# Patient Record
Sex: Female | Born: 1990 | Race: White | Hispanic: No | Marital: Single | Smoking: Never smoker
Health system: Southern US, Community
[De-identification: ages and names within clinical notes are randomized; demographics above are authoritative.]

## PROBLEM LIST (undated history)

## (undated) DIAGNOSIS — T7840XA Allergy, unspecified, initial encounter: Secondary | ICD-10-CM

## (undated) DIAGNOSIS — G43909 Migraine, unspecified, not intractable, without status migrainosus: Secondary | ICD-10-CM

## (undated) DIAGNOSIS — K219 Gastro-esophageal reflux disease without esophagitis: Secondary | ICD-10-CM

## (undated) DIAGNOSIS — N83209 Unspecified ovarian cyst, unspecified side: Secondary | ICD-10-CM

## (undated) DIAGNOSIS — R51 Headache: Secondary | ICD-10-CM

## (undated) DIAGNOSIS — R519 Headache, unspecified: Secondary | ICD-10-CM

## (undated) HISTORY — DX: Headache, unspecified: R51.9

## (undated) HISTORY — DX: Migraine, unspecified, not intractable, without status migrainosus: G43.909

## (undated) HISTORY — DX: Gastro-esophageal reflux disease without esophagitis: K21.9

## (undated) HISTORY — DX: Headache: R51

## (undated) HISTORY — DX: Unspecified ovarian cyst, unspecified side: N83.209

## (undated) HISTORY — DX: Allergy, unspecified, initial encounter: T78.40XA

## (undated) HISTORY — PX: EYE SURGERY: SHX253

---

## 2015-08-02 ENCOUNTER — Ambulatory Visit (INDEPENDENT_AMBULATORY_CARE_PROVIDER_SITE_OTHER): Payer: BLUE CROSS/BLUE SHIELD | Admitting: Primary Care

## 2015-08-02 ENCOUNTER — Other Ambulatory Visit (HOSPITAL_COMMUNITY)
Admission: RE | Admit: 2015-08-02 | Discharge: 2015-08-02 | Disposition: A | Discharge status: Home / Self Care | Payer: BLUE CROSS/BLUE SHIELD | Source: Ambulatory Visit | Attending: Primary Care | Admitting: Primary Care

## 2015-08-02 ENCOUNTER — Encounter: Payer: Self-pay | Admitting: Primary Care

## 2015-08-02 VITALS — BP 114/72 | HR 77 | Temp 98.6°F | Ht 65.0 in | Wt 190.8 lb

## 2015-08-02 DIAGNOSIS — Z01419 Encounter for gynecological examination (general) (routine) without abnormal findings: Secondary | ICD-10-CM | POA: Diagnosis present

## 2015-08-02 DIAGNOSIS — Z0000 Encounter for general adult medical examination without abnormal findings: Secondary | ICD-10-CM | POA: Diagnosis not present

## 2015-08-02 DIAGNOSIS — Z8742 Personal history of other diseases of the female genital tract: Secondary | ICD-10-CM

## 2015-08-02 DIAGNOSIS — Z1151 Encounter for screening for human papillomavirus (HPV): Secondary | ICD-10-CM | POA: Insufficient documentation

## 2015-08-02 DIAGNOSIS — Z124 Encounter for screening for malignant neoplasm of cervix: Secondary | ICD-10-CM | POA: Diagnosis not present

## 2015-08-02 DIAGNOSIS — R51 Headache: Secondary | ICD-10-CM

## 2015-08-02 DIAGNOSIS — Z8349 Family history of other endocrine, nutritional and metabolic diseases: Secondary | ICD-10-CM

## 2015-08-02 DIAGNOSIS — R519 Headache, unspecified: Secondary | ICD-10-CM | POA: Insufficient documentation

## 2015-08-02 LAB — LIPID PANEL
CHOL/HDL RATIO: 5
CHOLESTEROL: 197 mg/dL (ref 0–200)
HDL: 37.2 mg/dL — ABNORMAL LOW (ref >39.00)
LDL CALC: 135 mg/dL — AB (ref 0–99)
NonHDL: 160.27
Triglycerides: 124 mg/dL (ref 0.0–149.0)
VLDL: 24.8 mg/dL (ref 0.0–40.0)

## 2015-08-02 LAB — COMPREHENSIVE METABOLIC PANEL
ALBUMIN: 4.3 g/dL (ref 3.5–5.2)
ALT: 14 U/L (ref 0–35)
AST: 14 U/L (ref 0–37)
Alkaline Phosphatase: 67 U/L (ref 39–117)
BUN: 10 mg/dL (ref 6–23)
CHLORIDE: 103 meq/L (ref 96–112)
CO2: 27 meq/L (ref 19–32)
CREATININE: 0.82 mg/dL (ref 0.40–1.20)
Calcium: 9.4 mg/dL (ref 8.4–10.5)
GFR: 89.73 mL/min (ref >60.00)
GLUCOSE: 86 mg/dL (ref 70–99)
POTASSIUM: 3.6 meq/L (ref 3.5–5.1)
SODIUM: 137 meq/L (ref 135–145)
Total Bilirubin: 0.4 mg/dL (ref 0.2–1.2)
Total Protein: 7.8 g/dL (ref 6.0–8.3)

## 2015-08-02 LAB — T3, FREE: T3, Free: 3.6 pg/mL (ref 2.3–4.2)

## 2015-08-02 LAB — TSH: TSH: 2.91 u[IU]/mL (ref 0.35–4.50)

## 2015-08-02 LAB — T4, FREE: FREE T4: 0.81 ng/dL (ref 0.60–1.60)

## 2015-08-02 MED ORDER — PROPRANOLOL HCL ER 60 MG PO CP24: 60.0000 mg | ORAL_CAPSULE | Freq: Every day | ORAL | 1 refills | Status: DC

## 2015-08-02 NOTE — Assessment & Plan Note (Addendum)
Immunizations up-to-date. Pap completed in 2016, patient would like completed today, performed an Pap pending. History of right ovarian cyst that requires follow-up, pelvic and vaginal ultrasound ordered and are pending. Pelvic exam unremarkable today. Poor diet with some exercise. Discussed to reduce calorie consumption to 1200-1400 cal daily. Discussed to reduce intake of pasta, rice, processed carbohydrates. Increase lean protein and vegetables.  Exam unremarkable. Labs pending. Will check TSH given family history of hypothyroidism and presence of other symptoms.  Follow-up in one year for repeat physical or sooner if needed.

## 2015-08-02 NOTE — Assessment & Plan Note (Signed)
Currently managed on propanolol ER 60 mg once daily. Very well managed on this medication with hardly any headaches and infrequent migraines. Heart rate stable in office today. Neuro exam unremarkable. Refill upper panel all sent to pharmacy.

## 2015-08-02 NOTE — Patient Instructions (Signed)
Complete lab work prior to leaving today.   Work to increase consumption of vegetables, fruit, whole grains, water. Reduce carbohydrates in the form of bread, rice, pasta, granola.   Ensure you are consuming 64 ounces of water daily.  Continue regular exercise.  Try to aim for 1500 calories daily.  You will be contacted regarding your ultrasound.  Please let us know if you have not heard back within one week.   I will be in touch with you regarding your lab results, including your pap.  Follow up in 1 year for repeat physical.   It was a pleasure to meet you today! Please don't hesitate to call me with any questions. Welcome to [facility]!

## 2015-08-02 NOTE — Progress Notes (Signed)
Pre visit review using our clinic review tool, if applicable. No additional management support is needed unless otherwise documented below in the visit note. 

## 2015-08-02 NOTE — Progress Notes (Signed)
Subjective:    Patient ID: Jill Watson, female    DOB: [birth year], 25 y.o.   MRN: [id number]  HPI  Jill Watson is a 25 year old female who presents today to establish care, for complete physical, and discuss the problems mentioned below. Will obtain old records.  1) Frequent Headaches/Migraines: Diagnosed several years ago. Currently managed on Propranolol ER 60 mg since 2010. She experiencees headaches much less frequently since managed on Propranolol. She will experience migraines every 2-3 months which is much less frequent than prior to Propranolol use.Overall she feels well managed.  2) Family history of Hypothyroidism: Experiencing fatigue and difficulty losing weight intermittently since 2009. She does experience intolerance to the cold air. Denies hair growth, palpitations, chest pain. She would like evaluation given her symptoms and family history.  Immunizations: -Tetanus: Tdap in March 2017 -Influenza: Completed in Fall 2016  Diet: She endorses a healthy diet. She tracks calories on My Fitness Pal and will eat less than 1800 calories per day. Breakfast: Yogurt and granola Lunch: Frozen dinner, chicken and rice, sandwiches Dinner: Tacos, pizza, [place] burgers, pasta, chicken dishes Snacks: Strawberries, canned fruit, kiwi, chips, pretzel  Desserts: Chocolate pieces daily Beverages: Water, diet coke, crystal light  Exercise: She exercises three times weekly and will work out for 45 min-1 hour Eye exam: Completed in March 2017 Dental exam: Completes semi-annually Pap Smear: Completed in June 2016, normal.    Review of Systems  Constitutional: Negative for unexpected weight change.  HENT: Negative for rhinorrhea.   Respiratory: Negative for cough and shortness of breath.   Cardiovascular: Negative for chest pain.  Gastrointestinal: Negative for constipation and diarrhea.  Genitourinary: Negative for difficulty urinating and menstrual problem.  Musculoskeletal: Negative  for arthralgias and myalgias.  Skin: Negative for rash.  Allergic/Immunologic: Negative for environmental allergies.  Neurological: Negative for dizziness, numbness and headaches.  Psychiatric/Behavioral:       Denies concerns for anxiety or depression.       Past Medical History:  Diagnosis Date  . Allergy   . Frequent headaches   . GERD (gastroesophageal reflux disease)   . Migraine   . Ovarian cyst    Right     Social History   Social History  . Marital status: Single    Spouse name: N/A  . Number of children: N/A  . Years of education: N/A   Occupational History  . Not on file.   Social History Main Topics  . Smoking status: Never Smoker  . Smokeless tobacco: Never Used  . Alcohol use Yes     Comment: social  . Drug use: No  . Sexual activity: Not on file   Other Topics Concern  . Not on file   Social History Narrative   In a relationship.   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   Enjoys reading, baking, travel.    Past Surgical History:  Procedure Laterality Date  . EYE SURGERY  1993, 1994, 1999    Family History  Problem Relation Age of Onset  . Hyperlipidemia Mother   . Hypertension Mother   . Hypothyroidism Mother   . Hypertension Father   . Arthritis Maternal Grandmother   . Heart disease Maternal Grandmother   . Stroke Paternal Grandfather   . Heart disease Paternal Grandfather   . Ovarian cancer Other   . Breast cancer Other     No Known Allergies  No current outpatient prescriptions on file prior to  visit.   No current facility-administered medications on file prior to visit.     BP 114/72 (BP Location: Right Arm, Patient Position: Sitting, Cuff Size: Normal)   Pulse 77   Temp 98.6 F (37 C) (Oral)   Ht  (1.651 m)   Wt 190 lb 12.8 oz (86.5 kg)   LMP 07/16/2015   SpO2 98%   BMI 31.75 kg/m    Objective:   Physical Exam  Constitutional: She is oriented to person, place, and time. She  appears well-nourished.  HENT:  Right Ear: Tympanic membrane and ear canal normal.  Left Ear: Tympanic membrane and ear canal normal.  Nose: Nose normal.  Mouth/Throat: Oropharynx is clear and moist.  Eyes: Conjunctivae and EOM are normal. Pupils are equal, round, and reactive to light.  Neck: Neck supple. No thyromegaly present.  Cardiovascular: Normal rate and regular rhythm.   No murmur heard. Pulmonary/Chest: Effort normal and breath sounds normal. She has no rales.  Abdominal: Soft. Bowel sounds are normal. There is no tenderness.  Genitourinary: There is no tenderness on the right labia. There is no tenderness on the left labia. Cervix exhibits no motion tenderness and no discharge. Right adnexum displays no mass. Left adnexum displays no mass. No vaginal discharge found.  Musculoskeletal: Normal range of motion.  Lymphadenopathy:    She has no cervical adenopathy.  Neurological: She is alert and oriented to person, place, and time. She has normal reflexes. No cranial nerve deficit.  Skin: Skin is warm and dry. No rash noted.  Psychiatric: She has a normal mood and affect.          Assessment & Plan:

## 2015-08-05 ENCOUNTER — Encounter: Payer: Self-pay | Admitting: *Deleted

## 2015-08-06 ENCOUNTER — Encounter: Payer: Self-pay | Admitting: Primary Care

## 2015-08-06 ENCOUNTER — Other Ambulatory Visit: Payer: Self-pay | Admitting: Primary Care

## 2015-08-06 DIAGNOSIS — R5383 Other fatigue: Secondary | ICD-10-CM

## 2015-08-06 LAB — CYTOLOGY - PAP

## 2015-08-07 ENCOUNTER — Other Ambulatory Visit: Payer: Self-pay | Admitting: Primary Care

## 2015-08-07 ENCOUNTER — Encounter: Payer: Self-pay | Admitting: Primary Care

## 2015-08-07 DIAGNOSIS — Z3041 Encounter for surveillance of contraceptive pills: Secondary | ICD-10-CM

## 2015-08-07 MED ORDER — NORETHIN ACE-ETH ESTRAD-FE 1-20 MG-MCG PO TABS: 1.0000 | ORAL_TABLET | Freq: Every day | ORAL | 3 refills | Status: DC

## 2015-08-09 ENCOUNTER — Ambulatory Visit
Admission: RE | Admit: 2015-08-09 | Discharge: 2015-08-09 | Disposition: A | Discharge status: Home / Self Care | Payer: BLUE CROSS/BLUE SHIELD | Source: Ambulatory Visit | Attending: Primary Care | Admitting: Primary Care

## 2015-08-09 DIAGNOSIS — Z8742 Personal history of other diseases of the female genital tract: Secondary | ICD-10-CM | POA: Insufficient documentation

## 2016-02-01 ENCOUNTER — Other Ambulatory Visit: Payer: Self-pay | Admitting: Primary Care

## 2016-02-01 DIAGNOSIS — R51 Headache: Principal | ICD-10-CM

## 2016-02-01 DIAGNOSIS — R519 Headache, unspecified: Secondary | ICD-10-CM

## 2016-02-03 NOTE — Telephone Encounter (Signed)
Ok to refill? Electronically refill request for   propranolol ER (INDERAL LA) 60 MG 24 hr capsule  Last prescribed and seen on 08/02/2015. No future appt

## 2016-06-30 ENCOUNTER — Other Ambulatory Visit: Payer: Self-pay | Admitting: Primary Care

## 2016-06-30 DIAGNOSIS — Z3041 Encounter for surveillance of contraceptive pills: Secondary | ICD-10-CM

## 2016-07-30 ENCOUNTER — Other Ambulatory Visit: Payer: Self-pay | Admitting: Primary Care

## 2016-07-30 DIAGNOSIS — R51 Headache: Principal | ICD-10-CM

## 2016-07-30 DIAGNOSIS — R519 Headache, unspecified: Secondary | ICD-10-CM

## 2016-08-17 IMAGING — US US PELVIS COMPLETE
1 series · 14 of 25 positions shown · non-contrast
Comparison: None available in PACs

CLINICAL DATA: Follow-up right ovarian cyst

EXAM:
TRANSABDOMINAL AND TRANSVAGINAL ULTRASOUND OF PELVIS
TECHNIQUE: Both transabdominal and transvaginal ultrasound examinations of the
pelvis were performed. Transabdominal technique was performed for
global imaging of the pelvis including uterus, ovaries, adnexal
regions, and pelvic cul-de-sac. It was necessary to proceed with
endovaginal exam following the transabdominal exam to visualize the
uterus, endometrium, ovaries, and adnexal regions..

[Series 1: us pelvis complete · 0.22mm/px · 14 of 79 slices shown]
[im 1/79]
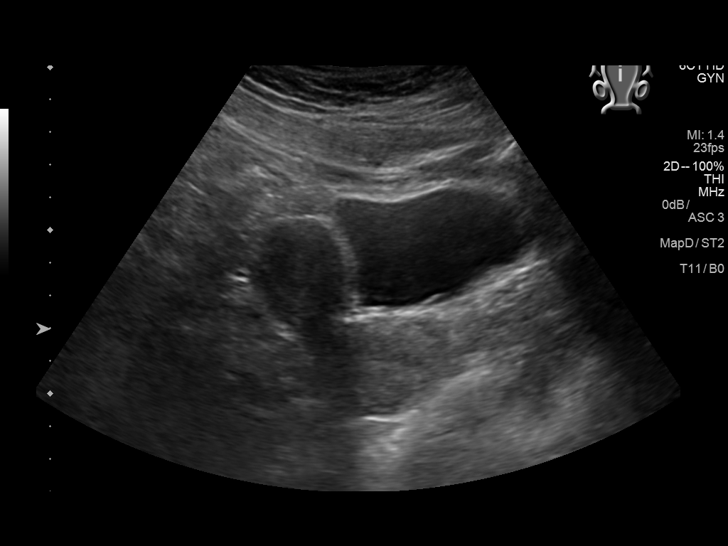
[im 7/79]
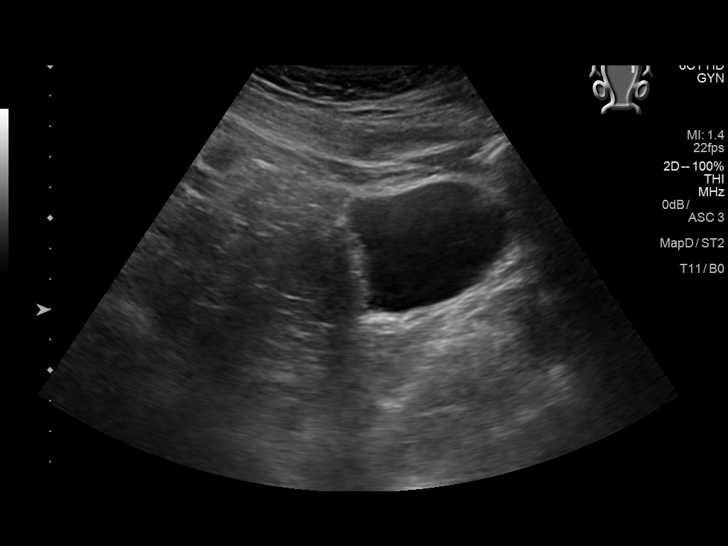
[im 14/79]
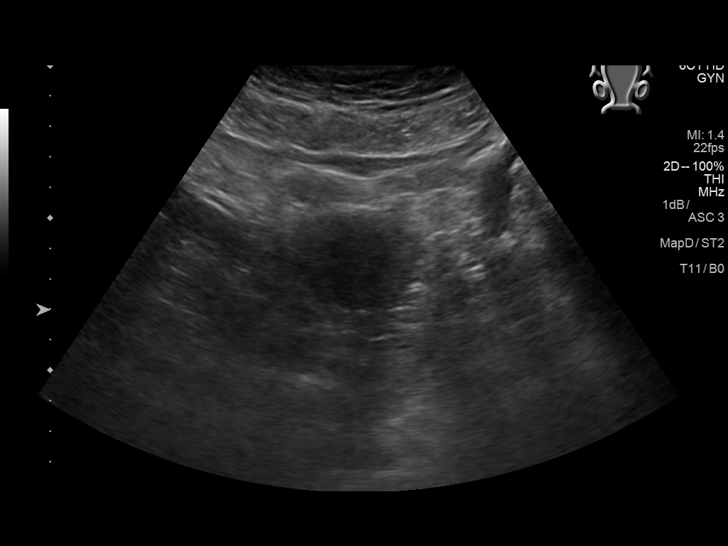
[im 20/79]
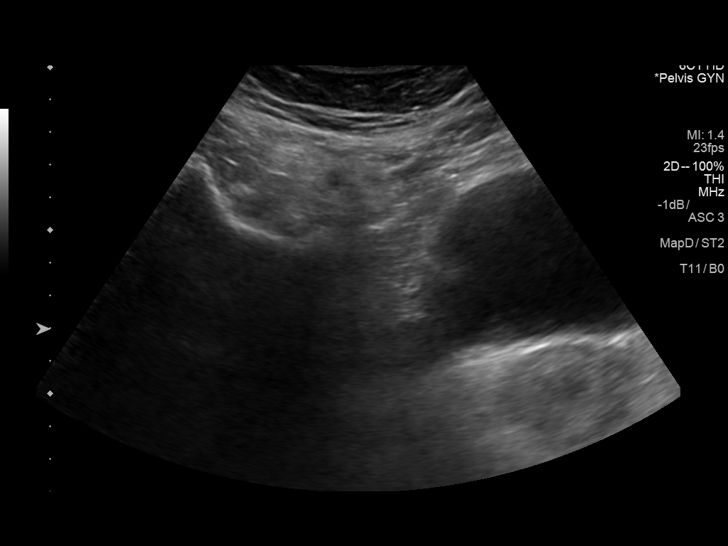
[im 27/79]
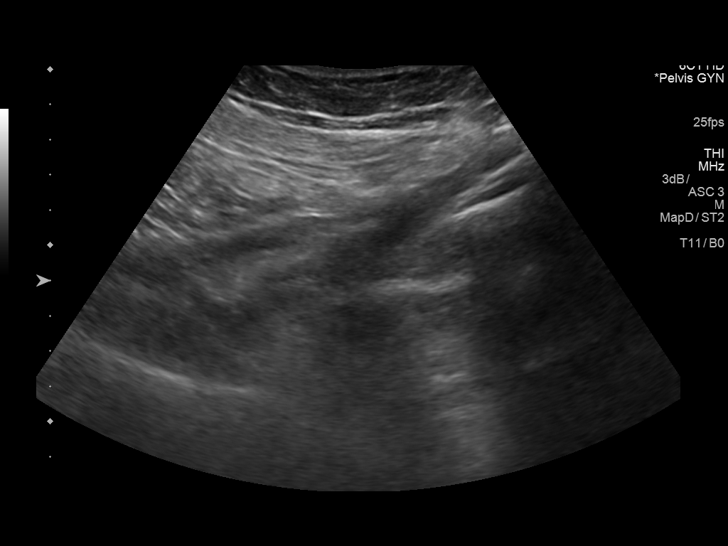
[im 30/79]
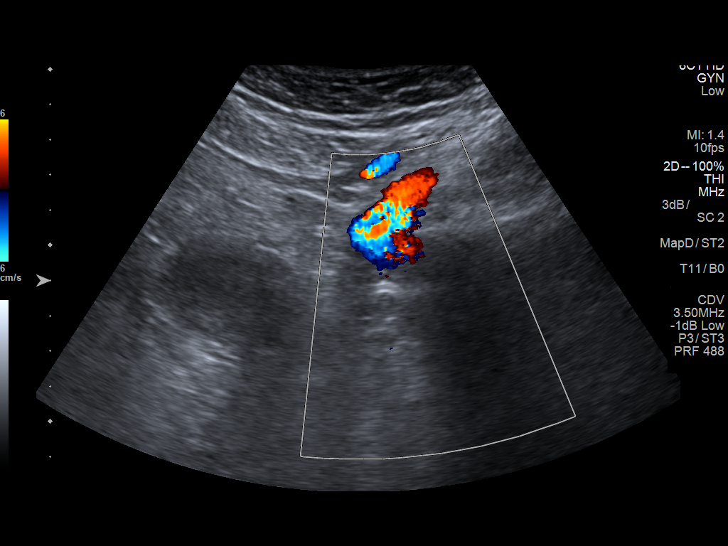
[im 36/79]
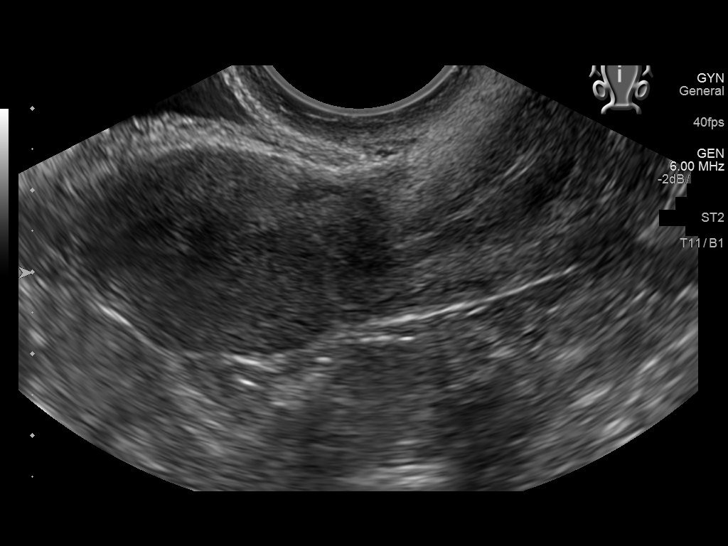
[im 43/79]
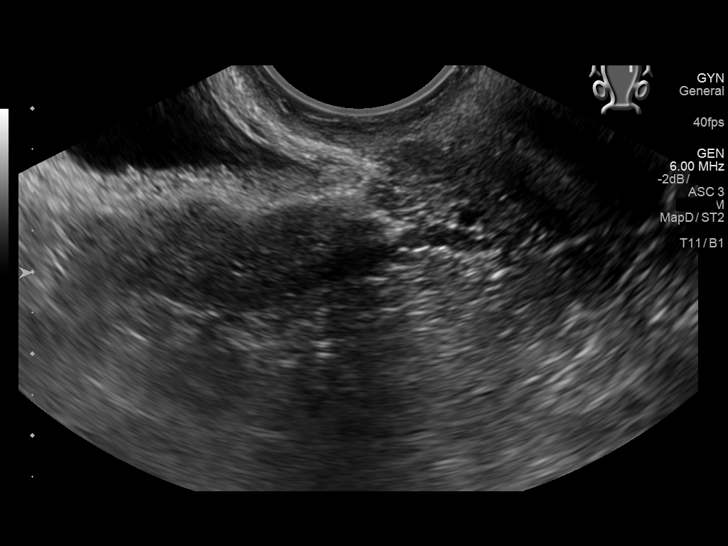
[im 49/79]
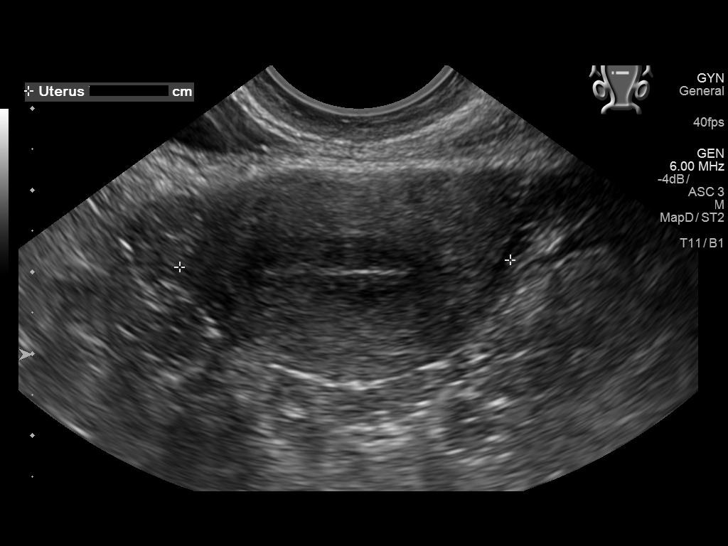
[im 53/79]
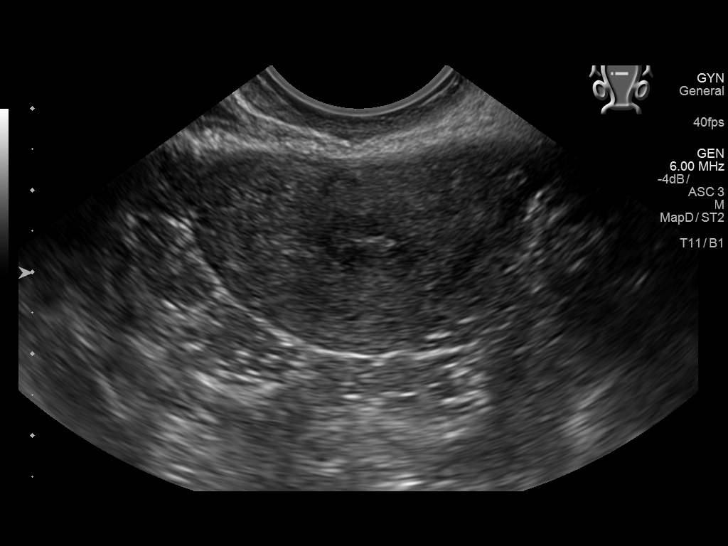
[im 59/79]
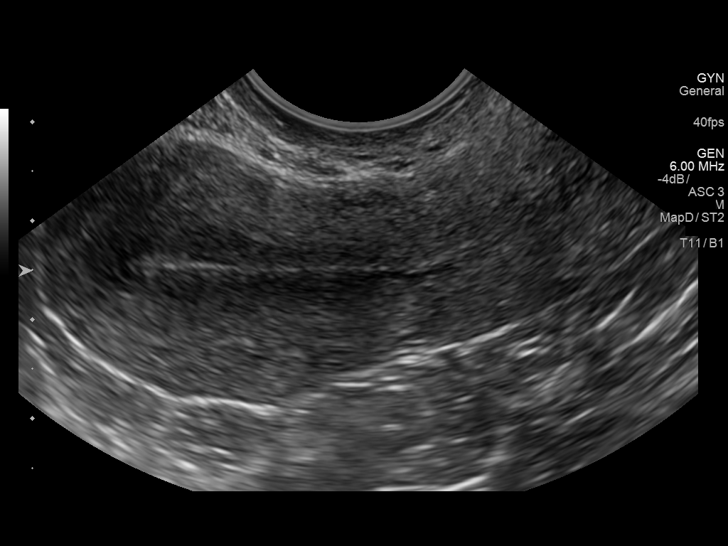
[im 66/79]
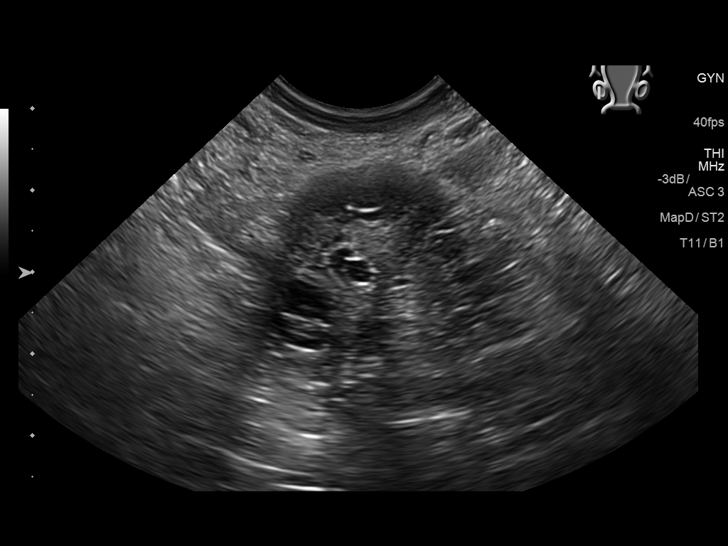
[im 72/79]
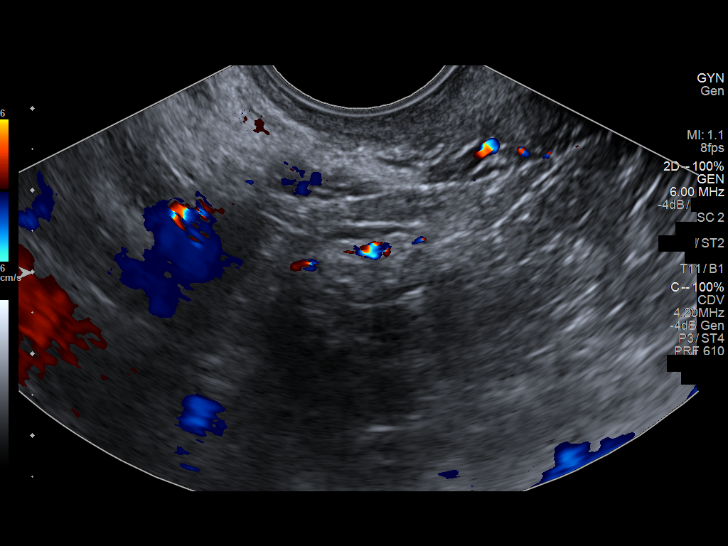
[im 79/79]
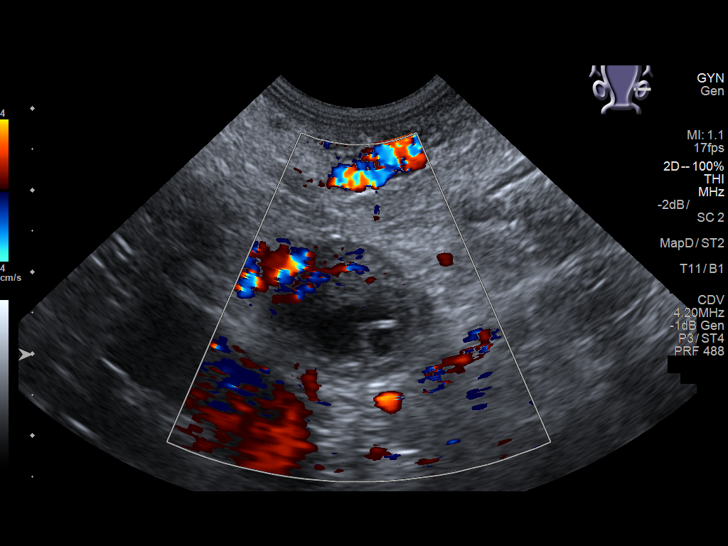

[14 of 25 positions shown; findings below may reference images not displayed]

FINDINGS: Uterus

Measurements: 6.5 x 3.4 x 4.3 cm. No fibroids or other mass
visualized.

Endometrium

Thickness: 3 mm.  No focal abnormality visualized.

Right ovary

Measurements: 2.7 x 1.7 x 1.6 cm. No cystic or solid ovarian mass is
observed.

Left ovary

Measurements: 2.1 x 2.2 x 1.8 cm. Normal appearance/no adnexal mass.

Other findings

No free pelvic fluid.
IMPRESSION: No cystic or solid adnexal mass is observed. Normal appearance of
the ovaries, uterus, and endometrium.

## 2016-09-29 ENCOUNTER — Other Ambulatory Visit: Payer: Self-pay | Admitting: Primary Care

## 2016-09-29 DIAGNOSIS — Z3041 Encounter for surveillance of contraceptive pills: Secondary | ICD-10-CM

## 2016-10-26 ENCOUNTER — Other Ambulatory Visit: Payer: Self-pay | Admitting: Primary Care

## 2016-10-26 DIAGNOSIS — R51 Headache: Principal | ICD-10-CM

## 2016-10-26 DIAGNOSIS — Z3041 Encounter for surveillance of contraceptive pills: Secondary | ICD-10-CM

## 2016-10-26 DIAGNOSIS — R519 Headache, unspecified: Secondary | ICD-10-CM

## 2016-10-28 ENCOUNTER — Ambulatory Visit
Admission: EM | Admit: 2016-10-28 | Discharge: 2016-10-28 | Disposition: A | Discharge status: Home / Self Care | Payer: BLUE CROSS/BLUE SHIELD | Attending: Family Medicine | Admitting: Family Medicine

## 2016-10-28 ENCOUNTER — Encounter: Payer: Self-pay | Admitting: *Deleted

## 2016-10-28 DIAGNOSIS — J029 Acute pharyngitis, unspecified: Secondary | ICD-10-CM

## 2016-10-28 DIAGNOSIS — J069 Acute upper respiratory infection, unspecified: Secondary | ICD-10-CM

## 2016-10-28 LAB — RAPID STREP SCREEN (MED CTR MEBANE ONLY): Streptococcus, Group A Screen (Direct): NEGATIVE

## 2016-10-28 NOTE — ED Triage Notes (Signed)
Sore throat, non-productive cough, headache x2 days.

## 2016-10-28 NOTE — ED Provider Notes (Signed)
[facility] ____________________________________________  Time seen: Approximately 2:58 PM  I have reviewed the triage vital signs and the nursing notes.   HISTORY  Chief Complaint Sore Throat; Cough; and Headache  HPI Jill Watson is a 26 y.o. female  Presents for evaluation of 3 days of sore throat with gradual onset of runny nose and nasal congestion with occasional cough and congestion. Patient reports sore throat has continued. Reports has continued to eat and drink well. States did take Aleve D today that did help some of her symptoms. Denies known fevers. States sore throat was worse in the mornings but somewhat throughout the day. 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 Reports has continued pain active. Denies other aggravating of the factors. Reports of the muscles well. Denies chest pain, shortness of breath, abdominal pain, dysuria, or rash. Denies recent sickness. Denies recent antibiotic use.   Patient's last menstrual period was 10/28/2016.Denies pregnancy. Doreene Nest, NP: PCP    Past Medical History:  Diagnosis Date  . Allergy   . Frequent headaches   . GERD (gastroesophageal reflux disease)   . Migraine   . Ovarian cyst    Right    Patient Active Problem List   Diagnosis Date Noted  . Preventative health care 08/02/2015  . Frequent headaches 08/02/2015    Past Surgical History:  Procedure Laterality Date  . EYE SURGERY  1993, 1994, 1999     No current facility-administered medications for this encounter.   Current Outpatient Prescriptions:  .  escitalopram (LEXAPRO) 5 MG tablet, Take 5 mg by mouth daily., Disp: , Rfl:  .  fexofenadine (ALLEGRA) 180 MG tablet, Take 180 mg by mouth daily., Disp: , Rfl:  .  norethindrone-ethinyl estradiol (JUNEL FE 1/20) 1-20 MG-MCG tablet, Take 1 tablet by mouth daily. NEED OFFICE VISIT FOR ANY MORE REFILLS, Disp: 28 tablet, Rfl: 0 .   propranolol ER (INDERAL LA) 60 MG 24 hr capsule, Take 1 capsule (60 mg total) by mouth daily. NEED OFFICE VISIT FOR ANY MORE REFILLS, Disp: 30 capsule, Rfl: 0  Allergies Patient has no known allergies.  Family History  Problem Relation Age of Onset  . Hyperlipidemia Mother   . Hypertension Mother   . Hypothyroidism Mother   . Hypertension Father   . Arthritis Maternal Grandmother   . Heart disease Maternal Grandmother   . Stroke Paternal Grandfather   . Heart disease Paternal Grandfather   . Ovarian cancer Other   . Breast cancer Other     Social History Social History  Substance Use Topics  . Smoking status: Never Smoker  . Smokeless tobacco: Never Used  . Alcohol use Yes     Comment: social    Review of Systems Constitutional: No fever/chills ENT: Positive sore throat. Cardiovascular: Denies chest pain. Respiratory: Denies shortness of breath. Gastrointestinal: No abdominal pain.  No nausea, no vomiting.  No diarrhea.   Musculoskeletal: Negative for back pain. Skin: Negative for rash.   ____________________________________________   PHYSICAL EXAM:  VITAL SIGNS: ED Triage Vitals  Enc Vitals Group     BP 10/28/16 1431 122/75     Pulse Rate 10/28/16 1431 60     Resp 10/28/16 1431 16     Temp 10/28/16 1431 98.1 F (36.7 C)     Temp Source 10/28/16 1431 Oral     SpO2 10/28/16 1431 100 %     Weight 10/28/16 1433 185 lb (83.9 kg)  Height 10/28/16 1433 5\' 6"  (1.676 m)     Head Circumference --      Peak Flow --      Pain Score --      Pain Loc --      Pain Edu? --      Excl. in GC? --     Constitutional: Alert and oriented. Well appearing and in no acute distress. Eyes: Conjunctivae are normal.  Head: Atraumatic. No sinus tenderness to palpation. No swelling. No erythema.  Ears: No erythema, normal TMs bilaterally.   Nose: Mild nasal congestion  Mouth/Throat: Mucous membranes are moist. Mild pharyngeal erythema. No tonsillar swelling or exudate.    Neck: No stridor.  No cervical spine tenderness to palpation. Hematological/Lymphatic/Immunilogical: No cervical lymphadenopathy. Cardiovascular: Normal rate, regular rhythm. Grossly normal heart sounds.  Good peripheral circulation. Respiratory: Normal respiratory effort.  No retractions. No wheezes, rales or rhonchi. Good air movement.  Gastrointestinal: Soft and nontender.  Musculoskeletal: Ambulatory with steady gait. No cervical, thoracic or lumbar tenderness to palpation. Neurologic:  Normal speech and language. No gait instability. Skin:  Skin appears warm, dry and intact. No rash noted. Psychiatric: Mood and affect are normal. Speech and behavior are normal.  ___________________________________________   LABS (all labs ordered are listed, but only abnormal results are displayed)  Labs Reviewed  RAPID STREP SCREEN (NOT AT [facility])  CULTURE, GROUP A STREP [facility])     PROCEDURES Procedures    INITIAL IMPRESSION / ASSESSMENT AND PLAN / ED COURSE  Pertinent labs & imaging results that were available during my care of the patient were reviewed by me and considered in my medical decision making (see chart for details).  Well-appearing patient. No acute distress. Suspect viral upper respiratory infection of viral pharyngitis. Quick strep negative, will culture. Encouraged rest, fluids, supportive care, declined prescription of viscous lidocaine. OTC supportive medications. Declines need of work or school note.   Discussed follow up with Primary care physician this week as needed. Discussed follow up and return parameters including no resolution or any worsening concerns. Patient verbalized understanding and agreed to plan.   ____________________________________________   FINAL CLINICAL IMPRESSION(S) / ED DIAGNOSES  Final diagnoses:  Upper respiratory tract infection, unspecified type  Pharyngitis, unspecified etiology     Discharge Medication List as of 10/28/2016  3:06 PM       Note: This dictation was prepared with Dragon dictation along with smaller phrase technology. Any transcriptional errors that result from this process are unintentional.         Renford DillsMiller, Tinleigh Whitmire, NP 10/28/16 1559

## 2016-10-28 NOTE — Discharge Instructions (Signed)
Rest. Drink plenty of fluids.  ° °Follow up with your primary care physician this week as needed. Return to Urgent care for new or worsening concerns.  ° °

## 2016-10-31 LAB — CULTURE, GROUP A STREP (THRC)

## 2016-12-30 ENCOUNTER — Ambulatory Visit (INDEPENDENT_AMBULATORY_CARE_PROVIDER_SITE_OTHER): Payer: BLUE CROSS/BLUE SHIELD | Admitting: Primary Care

## 2016-12-30 ENCOUNTER — Encounter: Payer: Self-pay | Admitting: Primary Care

## 2016-12-30 VITALS — BP 102/64 | HR 62 | Temp 98.1°F | Ht 65.75 in | Wt 192.8 lb

## 2016-12-30 DIAGNOSIS — F411 Generalized anxiety disorder: Secondary | ICD-10-CM

## 2016-12-30 DIAGNOSIS — Z3041 Encounter for surveillance of contraceptive pills: Secondary | ICD-10-CM

## 2016-12-30 DIAGNOSIS — R51 Headache: Secondary | ICD-10-CM | POA: Diagnosis not present

## 2016-12-30 DIAGNOSIS — Z0000 Encounter for general adult medical examination without abnormal findings: Secondary | ICD-10-CM

## 2016-12-30 DIAGNOSIS — R519 Headache, unspecified: Secondary | ICD-10-CM

## 2016-12-30 LAB — COMPREHENSIVE METABOLIC PANEL
ALT: 18 U/L (ref 0–35)
AST: 17 U/L (ref 0–37)
Albumin: 4.2 g/dL (ref 3.5–5.2)
Alkaline Phosphatase: 104 U/L (ref 39–117)
BUN: 13 mg/dL (ref 6–23)
CHLORIDE: 101 meq/L (ref 96–112)
CO2: 27 meq/L (ref 19–32)
CREATININE: 0.84 mg/dL (ref 0.40–1.20)
Calcium: 8.9 mg/dL (ref 8.4–10.5)
GFR: 86.33 mL/min (ref >60.00)
Glucose, Bld: 89 mg/dL (ref 70–99)
Potassium: 3.9 mEq/L (ref 3.5–5.1)
SODIUM: 137 meq/L (ref 135–145)
Total Bilirubin: 0.5 mg/dL (ref 0.2–1.2)
Total Protein: 7.5 g/dL (ref 6.0–8.3)

## 2016-12-30 LAB — LIPID PANEL
CHOL/HDL RATIO: 5
Cholesterol: 185 mg/dL (ref 0–200)
HDL: 37.4 mg/dL — ABNORMAL LOW (ref >39.00)
LDL CALC: 125 mg/dL — AB (ref 0–99)
NonHDL: 147.85
Triglycerides: 112 mg/dL (ref 0.0–149.0)
VLDL: 22.4 mg/dL (ref 0.0–40.0)

## 2016-12-30 MED ORDER — NORETHIN ACE-ETH ESTRAD-FE 1-20 MG-MCG PO TABS: 1.0000 | ORAL_TABLET | Freq: Every day | ORAL | 3 refills | Status: AC

## 2016-12-30 MED ORDER — PROPRANOLOL HCL ER 60 MG PO CP24: 60.0000 mg | ORAL_CAPSULE | Freq: Every day | ORAL | 1 refills | Status: DC

## 2016-12-30 MED ORDER — ESCITALOPRAM OXALATE 5 MG PO TABS: 5.0000 mg | ORAL_TABLET | Freq: Every day | ORAL | 1 refills | Status: AC

## 2016-12-30 NOTE — Progress Notes (Signed)
Subjective:    Patient ID: Jill Watson, female    DOB: [birth year], 26 y.o.   MRN: [id number]  HPI  Jill Watson is a 26 year old female who presents today for complete physical and medication refills.   Frequent Headaches: She is needing a refill of her propranolol ER 60 mg once daily for prevention of recurrent headaches.   Menorrhagia: Long history of, previously on oral contraceptives for the past 10 years, stopped taking one month ago as she's curious to see how she does. She did notice her last menstrual cycle was heavier than normal.   GAD: Diagnosed in August 2018, currently managed on Lexapro 5 mg. Overall she's noticed some difference in her anxiety. 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   Immunizations: -Tetanus: Completed in 2017 -Influenza: Completed this season   Diet: She endorses a fair diet. Breakfast: Granola bar, yogurt Lunch: Vegetables, sandwiches, fast food Dinner: Meat, vegetable, starch Snacks: Occasionally, vegetables, yogurt, granola bar Desserts: Candy, 2-3 times weekly Beverages: Water, diet soda, sparkling water  Exercise: She is not currently exercising Eye exam: Completed in March 2018 Dental exam: Completes semi-annually  Pap Smear: Completed in 2017, negative   Review of Systems  Constitutional: Negative for unexpected weight change.  HENT: Negative for rhinorrhea.   Respiratory: Negative for cough and shortness of breath.   Cardiovascular: Negative for chest pain.  Gastrointestinal: Negative for constipation and diarrhea.  Genitourinary: Negative for difficulty urinating and menstrual problem.  Musculoskeletal: Negative for arthralgias and myalgias.  Skin: Negative for rash.  Allergic/Immunologic: Negative for environmental allergies.  Neurological: Negative for dizziness, numbness and headaches.  Psychiatric/Behavioral:       Doing well on Lexapro       Past Medical History:  Diagnosis Date  .  Allergy   . Frequent headaches   . GERD (gastroesophageal reflux disease)   . Migraine   . Ovarian cyst    Right     Social History   Socioeconomic History  . Marital status: Single    Spouse name: Not on file  . Number of children: Not on file  . Years of education: Not on file  . Highest education level: Not on file  Social Needs  . Financial resource strain: Not on file  . Food insecurity - worry: Not on file  . Food insecurity - inability: Not on file  . Transportation needs - medical: Not on file  . Transportation needs - non-medical: Not on file  Occupational History  . Not on file  Tobacco Use  . Smoking status: Never Smoker  . Smokeless tobacco: Never Used  Substance and Sexual Activity  . Alcohol use: Yes    Comment: social  . Drug use: No  . Sexual activity: Not on file  Other Topics Concern  . Not on file  Social History Narrative   In a relationship.   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   Enjoys reading, baking, travel.    Past Surgical History:  Procedure Laterality Date  . EYE SURGERY  1993, 1994, 1999    Family History  Problem Relation Age of Onset  . Hyperlipidemia Mother   . Hypertension Mother   . Hypothyroidism Mother   . Hypertension Father   . Arthritis Maternal Grandmother   . Heart disease Maternal Grandmother   . Stroke Paternal Grandfather   . Heart disease Paternal Grandfather   . Ovarian cancer  Other   . Breast cancer Other     No Known Allergies  Current Outpatient Medications on File Prior to Visit  Medication Sig Dispense Refill  . fexofenadine (ALLEGRA) 180 MG tablet Take 180 mg by mouth daily.     No current facility-administered medications on file prior to visit.     BP 102/64 (BP Location: Left Arm, Patient Position: Sitting, Cuff Size: Normal)   Pulse 62   Temp 98.1 F (36.7 C) (Oral)   Ht 5' 5.75" (1.67 m)   Wt 192 lb 12 oz (87.4 kg)   LMP 12/21/2016   SpO2 98%   BMI  31.35 kg/m    Objective:   Physical Exam  Constitutional: She is oriented to person, place, and time. She appears well-nourished.  HENT:  Right Ear: Tympanic membrane and ear canal normal.  Left Ear: Tympanic membrane and ear canal normal.  Nose: Nose normal.  Mouth/Throat: Oropharynx is clear and moist.  Eyes: Conjunctivae and EOM are normal. Pupils are equal, round, and reactive to light.  Neck: Neck supple. No thyromegaly present.  Cardiovascular: Normal rate and regular rhythm.  No murmur heard. Pulmonary/Chest: Effort normal and breath sounds normal. She has no rales.  Abdominal: Soft. Bowel sounds are normal. There is no tenderness.  Musculoskeletal: Normal range of motion.  Lymphadenopathy:    She has no cervical adenopathy.  Neurological: She is alert and oriented to person, place, and time. She has normal reflexes. No cranial nerve deficit.  Skin: Skin is warm and dry. No rash noted.  Psychiatric: She has a normal mood and affect.          Assessment & Plan:

## 2016-12-30 NOTE — Patient Instructions (Signed)
Stop by the lab prior to leaving today. I will notify you of your results once received.   Start exercising. You should be getting 150 minutes of moderate intensity exercise weekly.  It's important to improve your diet by reducing consumption of fast food, fried food, processed snack foods, sugary drinks. Increase consumption of fresh vegetables and fruits, whole grains, water.  Ensure you are drinking 64 ounces of water daily.  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  It was a pleasure to see you today!   Preventive Care 18-39 Years, Female Preventive care refers to lifestyle choices and visits with your health care provider that can promote health and wellness. What does preventive care include?  A yearly physical exam. This is also called an annual well check.  Dental exams once or twice a year.  Routine eye exams. Ask your health care provider how often you should have your eyes checked.  Personal lifestyle choices, including: ? Daily care of your teeth and gums. ? Regular physical activity. ? Eating a healthy diet. ? Avoiding tobacco and drug use. ? Limiting alcohol use. ? Practicing safe sex. ? Taking vitamin and mineral supplements as recommended by your health care provider. What happens during an annual well check? The services and screenings done by your health care provider during your annual well check will depend on your age, overall health, lifestyle risk factors, and family history of disease. Counseling Your health care provider may ask you questions about your:  Alcohol use.  Tobacco use.  Drug use.  Emotional well-being.  Home and relationship well-being.  Sexual activity.  Eating habits.  Work and work Statistician.  Method of birth control.  Menstrual cycle.  Pregnancy history.  Screening You may have the following tests or measurements:  Height, weight, and BMI.  Diabetes screening. This is done by checking your blood sugar  (glucose) after you have not eaten for a while (fasting).  Blood pressure.  Lipid and cholesterol levels. These may be checked every 5 years starting at age 82.  Skin check.  Hepatitis C blood test.  Hepatitis B blood test.  Sexually transmitted disease (STD) testing.  BRCA-related cancer screening. This may be done if you have a family history of breast, ovarian, tubal, or peritoneal cancers.  Pelvic exam and Pap test. This may be done every 3 years starting at age 65. Starting at age 80, this may be done every 5 years if you have a Pap test in combination with an HPV test.  Discuss your test results, treatment options, and if necessary, the need for more tests with your health care provider. Vaccines Your health care provider may recommend certain vaccines, such as:  Influenza vaccine. This is recommended every year.  Tetanus, diphtheria, and acellular pertussis (Tdap, Td) vaccine. You may need a Td booster every 10 years.  Varicella vaccine. You may need this if you have not been vaccinated.  HPV vaccine. If you are 76 or younger, you may need three doses over 6 months.  Measles, mumps, and rubella (MMR) vaccine. You may need at least one dose of MMR. You may also need a second dose.  Pneumococcal 13-valent conjugate (PCV13) vaccine. You may need this if you have certain conditions and were not previously vaccinated.  Pneumococcal polysaccharide (PPSV23) vaccine. You may need one or two doses if you smoke cigarettes or if you have certain conditions.  Meningococcal vaccine. One dose is recommended if you are age 29-21 years and a Advertising account planner  college student living in a residence hall, or if you have one of several medical conditions. You may also need additional booster doses.  Hepatitis A vaccine. You may need this if you have certain conditions or if you travel or work in places where you may be exposed to hepatitis A.  Hepatitis B vaccine. You may need this if you have  certain conditions or if you travel or work in places where you may be exposed to hepatitis B.  Haemophilus influenzae type b (Hib) vaccine. You may need this if you have certain risk factors.  Talk to your health care provider about which screenings and vaccines you need and how often you need them. This information is not intended to replace advice given to you by your health care provider. Make sure you discuss any questions you have with your health care provider. Document Released: 02/17/2001 Document Revised: 09/11/2015 Document Reviewed: 10/23/2014 Elsevier Interactive Patient Education  [employer].

## 2016-12-30 NOTE — Assessment & Plan Note (Signed)
Doing well on propranolol 60 mg once daily, continue same. Refills sent to pharmacy.

## 2016-12-30 NOTE — Assessment & Plan Note (Signed)
Overall feels well managed on Lexapro 5 mg, continue same. Refills sent to pharmacy. Denies SI/HI.

## 2016-12-30 NOTE — Assessment & Plan Note (Signed)
Immunizations UTD. Pap UTD. Discussed the importance of a healthy diet and regular exercise in order for weight loss, and to reduce the risk of any potential medical problems. Exam unremarkable. Labs pending. Follow up in 1 year.

## 2017-01-07 ENCOUNTER — Telehealth: Payer: Self-pay | Admitting: Primary Care

## 2017-01-07 NOTE — Telephone Encounter (Signed)
Best number [phone] Pt dropped off health examination certificate to be filled out She stated she has a copy of her TB test.  In rx tower up front

## 2017-01-07 NOTE — Telephone Encounter (Signed)
Form completed and placed in Chan's inbox. 

## 2017-01-07 NOTE — Telephone Encounter (Signed)
Placed form in Kate's inbox for review. 

## 2017-01-08 NOTE — Telephone Encounter (Signed)
Spoken to patient and form left in the front office for patient.

## 2017-07-05 ENCOUNTER — Other Ambulatory Visit: Payer: Self-pay | Admitting: Primary Care

## 2017-07-05 DIAGNOSIS — R51 Headache: Principal | ICD-10-CM

## 2017-07-05 DIAGNOSIS — R519 Headache, unspecified: Secondary | ICD-10-CM
